# Patient Record
Sex: Female | Born: 2000 | Race: White | Hispanic: No | Marital: Single | Smoking: Never smoker
Health system: Southern US, Community
[De-identification: ages and names within clinical notes are randomized; demographics above are authoritative.]

---
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
## 2022-01-16 ENCOUNTER — Emergency Department: Payer: Medicaid Other

## 2022-01-16 ENCOUNTER — Encounter: Payer: Self-pay | Admitting: Radiology

## 2022-01-16 ENCOUNTER — Emergency Department
Admission: EM | Admit: 2022-01-16 | Discharge: 2022-01-16 | Disposition: A | Discharge status: Home / Self Care | Payer: Medicaid Other | Attending: Emergency Medicine | Admitting: Emergency Medicine

## 2022-01-16 ENCOUNTER — Other Ambulatory Visit: Payer: Self-pay

## 2022-01-16 DIAGNOSIS — S59902A Unspecified injury of left elbow, initial encounter: Secondary | ICD-10-CM | POA: Diagnosis not present

## 2022-01-16 MED ORDER — NAPROXEN 500 MG PO TABS: 500.0000 mg | ORAL_TABLET | Freq: Two times a day (BID) | ORAL | 11 refills | Status: AC

## 2022-01-16 MED ORDER — CYCLOBENZAPRINE HCL 10 MG PO TABS: 10.0000 mg | ORAL_TABLET | Freq: Three times a day (TID) | ORAL | 0 refills | Status: AC | PRN
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
## 2022-01-16 NOTE — Discharge Instructions (Addendum)
-  Ice the elbow periodically throughout the day.  Recommend a elbow sleeve for Ace bandage to help reduce swelling.  -You may take naproxen as needed for pain.  You may additionally take cyclobenzaprine for muscle relaxation, though use caution as it may make you dizzy/drowsy.  -Follow-up with the orthopedist listed in these instructions if your symptoms fail to improve after 1 to 2 weeks.  -Return to the emergency department anytime if you begin to experience any new or worsening symptoms.

## 2022-10-13 DIAGNOSIS — R110 Nausea: Secondary | ICD-10-CM | POA: Diagnosis not present

## 2022-10-13 DIAGNOSIS — E860 Dehydration: Secondary | ICD-10-CM | POA: Diagnosis not present

## 2022-10-20 DIAGNOSIS — Z3A08 8 weeks gestation of pregnancy: Secondary | ICD-10-CM | POA: Diagnosis not present

## 2022-10-20 DIAGNOSIS — O211 Hyperemesis gravidarum with metabolic disturbance: Secondary | ICD-10-CM | POA: Diagnosis not present

## 2022-10-20 DIAGNOSIS — O210 Mild hyperemesis gravidarum: Secondary | ICD-10-CM | POA: Diagnosis not present

## 2022-10-25 DIAGNOSIS — N8311 Corpus luteum cyst of right ovary: Secondary | ICD-10-CM | POA: Diagnosis not present

## 2022-10-25 DIAGNOSIS — Z3A09 9 weeks gestation of pregnancy: Secondary | ICD-10-CM | POA: Diagnosis not present

## 2022-10-25 DIAGNOSIS — Z3201 Encounter for pregnancy test, result positive: Secondary | ICD-10-CM | POA: Diagnosis not present

## 2022-10-25 DIAGNOSIS — O3481 Maternal care for other abnormalities of pelvic organs, first trimester: Secondary | ICD-10-CM | POA: Diagnosis not present

## 2022-11-04 DIAGNOSIS — Z369 Encounter for antenatal screening, unspecified: Secondary | ICD-10-CM | POA: Diagnosis not present

## 2022-11-23 DIAGNOSIS — Z62810 Personal history of physical and sexual abuse in childhood: Secondary | ICD-10-CM | POA: Diagnosis not present

## 2022-11-23 DIAGNOSIS — F1290 Cannabis use, unspecified, uncomplicated: Secondary | ICD-10-CM | POA: Diagnosis not present

## 2022-11-23 DIAGNOSIS — O210 Mild hyperemesis gravidarum: Secondary | ICD-10-CM | POA: Diagnosis not present

## 2022-11-23 DIAGNOSIS — O99322 Drug use complicating pregnancy, second trimester: Secondary | ICD-10-CM | POA: Diagnosis not present

## 2022-11-23 DIAGNOSIS — Z31430 Encounter of female for testing for genetic disease carrier status for procreative management: Secondary | ICD-10-CM | POA: Diagnosis not present

## 2022-11-23 DIAGNOSIS — Z8659 Personal history of other mental and behavioral disorders: Secondary | ICD-10-CM | POA: Diagnosis not present

## 2022-11-23 DIAGNOSIS — Z36 Encounter for antenatal screening for chromosomal anomalies: Secondary | ICD-10-CM | POA: Diagnosis not present

## 2022-11-23 DIAGNOSIS — Z3A13 13 weeks gestation of pregnancy: Secondary | ICD-10-CM | POA: Diagnosis not present

## 2022-11-23 DIAGNOSIS — Z8279 Family history of other congenital malformations, deformations and chromosomal abnormalities: Secondary | ICD-10-CM | POA: Diagnosis not present

## 2022-11-23 DIAGNOSIS — Z363 Encounter for antenatal screening for malformations: Secondary | ICD-10-CM | POA: Diagnosis not present

## 2022-11-23 DIAGNOSIS — Z3481 Encounter for supervision of other normal pregnancy, first trimester: Secondary | ICD-10-CM | POA: Diagnosis not present

## 2023-01-03 DIAGNOSIS — O99322 Drug use complicating pregnancy, second trimester: Secondary | ICD-10-CM | POA: Diagnosis not present

## 2023-01-03 DIAGNOSIS — F1290 Cannabis use, unspecified, uncomplicated: Secondary | ICD-10-CM | POA: Diagnosis not present

## 2023-01-03 DIAGNOSIS — Z3A19 19 weeks gestation of pregnancy: Secondary | ICD-10-CM | POA: Diagnosis not present
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
## 2023-01-24 DIAGNOSIS — Z23 Encounter for immunization: Secondary | ICD-10-CM | POA: Diagnosis not present

## 2023-03-07 DIAGNOSIS — Z62810 Personal history of physical and sexual abuse in childhood: Secondary | ICD-10-CM | POA: Diagnosis not present

## 2023-03-07 DIAGNOSIS — O219 Vomiting of pregnancy, unspecified: Secondary | ICD-10-CM | POA: Diagnosis not present

## 2023-03-07 DIAGNOSIS — Z8659 Personal history of other mental and behavioral disorders: Secondary | ICD-10-CM | POA: Diagnosis not present

## 2023-03-07 DIAGNOSIS — Z148 Genetic carrier of other disease: Secondary | ICD-10-CM | POA: Diagnosis not present

## 2023-03-07 DIAGNOSIS — Z369 Encounter for antenatal screening, unspecified: Secondary | ICD-10-CM | POA: Diagnosis not present

## 2023-03-07 DIAGNOSIS — O99323 Drug use complicating pregnancy, third trimester: Secondary | ICD-10-CM | POA: Diagnosis not present

## 2023-03-07 DIAGNOSIS — Z3A28 28 weeks gestation of pregnancy: Secondary | ICD-10-CM | POA: Diagnosis not present

## 2023-03-07 DIAGNOSIS — F1290 Cannabis use, unspecified, uncomplicated: Secondary | ICD-10-CM | POA: Diagnosis not present

## 2023-03-07 DIAGNOSIS — Z3689 Encounter for other specified antenatal screening: Secondary | ICD-10-CM | POA: Diagnosis not present

## 2023-03-21 DIAGNOSIS — Z23 Encounter for immunization: Secondary | ICD-10-CM | POA: Diagnosis not present

## 2023-04-04 DIAGNOSIS — O99323 Drug use complicating pregnancy, third trimester: Secondary | ICD-10-CM | POA: Diagnosis not present

## 2023-04-04 DIAGNOSIS — F1910 Other psychoactive substance abuse, uncomplicated: Secondary | ICD-10-CM | POA: Diagnosis not present

## 2023-04-04 DIAGNOSIS — Z3A32 32 weeks gestation of pregnancy: Secondary | ICD-10-CM | POA: Diagnosis not present

## 2023-05-02 DIAGNOSIS — Z148 Genetic carrier of other disease: Secondary | ICD-10-CM | POA: Diagnosis not present

## 2023-05-02 DIAGNOSIS — O99323 Drug use complicating pregnancy, third trimester: Secondary | ICD-10-CM | POA: Diagnosis not present

## 2023-05-02 DIAGNOSIS — Z8279 Family history of other congenital malformations, deformations and chromosomal abnormalities: Secondary | ICD-10-CM | POA: Diagnosis not present

## 2023-05-02 DIAGNOSIS — Z3689 Encounter for other specified antenatal screening: Secondary | ICD-10-CM | POA: Diagnosis not present

## 2023-05-02 DIAGNOSIS — F1290 Cannabis use, unspecified, uncomplicated: Secondary | ICD-10-CM | POA: Diagnosis not present

## 2023-05-02 DIAGNOSIS — Z62810 Personal history of physical and sexual abuse in childhood: Secondary | ICD-10-CM | POA: Diagnosis not present

## 2023-05-02 DIAGNOSIS — Z8659 Personal history of other mental and behavioral disorders: Secondary | ICD-10-CM | POA: Diagnosis not present

## 2023-05-02 DIAGNOSIS — Z369 Encounter for antenatal screening, unspecified: Secondary | ICD-10-CM | POA: Diagnosis not present

## 2023-05-02 DIAGNOSIS — Z3A36 36 weeks gestation of pregnancy: Secondary | ICD-10-CM | POA: Diagnosis not present

## 2023-05-16 DIAGNOSIS — O99820 Streptococcus B carrier state complicating pregnancy: Secondary | ICD-10-CM | POA: Diagnosis not present

## 2023-05-16 DIAGNOSIS — Z62810 Personal history of physical and sexual abuse in childhood: Secondary | ICD-10-CM | POA: Diagnosis not present

## 2023-05-16 DIAGNOSIS — O99323 Drug use complicating pregnancy, third trimester: Secondary | ICD-10-CM | POA: Diagnosis not present

## 2023-05-16 DIAGNOSIS — F1290 Cannabis use, unspecified, uncomplicated: Secondary | ICD-10-CM | POA: Diagnosis not present

## 2023-05-16 DIAGNOSIS — O1203 Gestational edema, third trimester: Secondary | ICD-10-CM | POA: Diagnosis not present

## 2023-05-16 DIAGNOSIS — Z3A38 38 weeks gestation of pregnancy: Secondary | ICD-10-CM | POA: Diagnosis not present

## 2023-05-16 DIAGNOSIS — Z8279 Family history of other congenital malformations, deformations and chromosomal abnormalities: Secondary | ICD-10-CM | POA: Diagnosis not present

## 2023-05-16 DIAGNOSIS — Z148 Genetic carrier of other disease: Secondary | ICD-10-CM | POA: Diagnosis not present

## 2023-05-16 DIAGNOSIS — Z8659 Personal history of other mental and behavioral disorders: Secondary | ICD-10-CM | POA: Diagnosis not present

## 2023-05-21 DIAGNOSIS — O99013 Anemia complicating pregnancy, third trimester: Secondary | ICD-10-CM | POA: Diagnosis not present

## 2023-05-21 DIAGNOSIS — N939 Abnormal uterine and vaginal bleeding, unspecified: Secondary | ICD-10-CM | POA: Diagnosis not present

## 2023-05-21 DIAGNOSIS — Z79899 Other long term (current) drug therapy: Secondary | ICD-10-CM | POA: Diagnosis not present

## 2023-05-21 DIAGNOSIS — O26853 Spotting complicating pregnancy, third trimester: Secondary | ICD-10-CM | POA: Diagnosis not present

## 2023-05-21 DIAGNOSIS — D649 Anemia, unspecified: Secondary | ICD-10-CM | POA: Diagnosis not present

## 2023-05-21 DIAGNOSIS — Z87891 Personal history of nicotine dependence: Secondary | ICD-10-CM | POA: Diagnosis not present

## 2023-05-21 DIAGNOSIS — Z3A38 38 weeks gestation of pregnancy: Secondary | ICD-10-CM | POA: Diagnosis not present

## 2023-05-25 DIAGNOSIS — O9902 Anemia complicating childbirth: Secondary | ICD-10-CM | POA: Diagnosis not present

## 2023-05-25 DIAGNOSIS — Z148 Genetic carrier of other disease: Secondary | ICD-10-CM | POA: Diagnosis not present

## 2023-05-25 DIAGNOSIS — O471 False labor at or after 37 completed weeks of gestation: Secondary | ICD-10-CM | POA: Diagnosis not present

## 2023-05-25 DIAGNOSIS — O99324 Drug use complicating childbirth: Secondary | ICD-10-CM | POA: Diagnosis not present

## 2023-05-25 DIAGNOSIS — Z3A39 39 weeks gestation of pregnancy: Secondary | ICD-10-CM | POA: Diagnosis not present

## 2023-05-25 DIAGNOSIS — F1290 Cannabis use, unspecified, uncomplicated: Secondary | ICD-10-CM | POA: Diagnosis not present

## 2023-05-26 DIAGNOSIS — Z3A39 39 weeks gestation of pregnancy: Secondary | ICD-10-CM | POA: Diagnosis not present

## 2023-05-27 DIAGNOSIS — Z3A39 39 weeks gestation of pregnancy: Secondary | ICD-10-CM | POA: Diagnosis not present

## 2023-10-17 ENCOUNTER — Telehealth: Payer: Self-pay | Admitting: *Deleted

## 2023-10-17 DIAGNOSIS — Z7189 Other specified counseling: Secondary | ICD-10-CM

## 2023-10-17 NOTE — Progress Notes (Unsigned)
 Complex Care Management Note Care Guide Note  10/17/2023 Name: Courtney Sandoval MRN: [id number] DOB: November 14, 2000   Complex Care Management Outreach Attempts: An unsuccessful telephone outreach was attempted today to offer the patient information about available complex care management services.  Follow Up Plan:  Additional outreach attempts will be made to offer the patient complex care management information and services.   Encounter Outcome:  No Answer  Thedford Franks, CMA [place]  [facility], [facility] Guide Direct Dial: [phone]  Fax: [phone] Website: [email or web address]

## 2023-10-18 NOTE — Progress Notes (Unsigned)
 Complex Care Management Note Care Guide Note  10/18/2023 Name: Courtney Sandoval MRN: [id number] DOB: 2000/05/29   Complex Care Management Outreach Attempts: A second unsuccessful outreach was attempted today to offer the patient with information about available complex care management services.  Follow Up Plan:  Additional outreach attempts will be made to offer the patient complex care management information and services.   Encounter Outcome:  No Answer  Thedford Franks, CMA [place]  [facility], [facility] Guide Direct Dial: [phone]  Fax: [phone] Website: [email or web address]

## 2023-10-19 NOTE — Progress Notes (Signed)
 Complex Care Management Note Care Guide Note  10/19/2023 Name: Courtney Sandoval MRN: [id number] DOB: Aug 25, 2000   Complex Care Management Outreach Attempts: A third unsuccessful outreach was attempted today to offer the patient with information about available complex care management services.  Follow Up Plan:  No further outreach attempts will be made at this time. We have been unable to contact the patient to offer or enroll patient in complex care management services.  Encounter Outcome:  No Answer  Thedford Franks, CMA [place]  [facility], [facility] Guide Direct Dial: [phone]  Fax: [phone] Website: [email or web address]

## 2023-12-14 DIAGNOSIS — R059 Cough, unspecified: Secondary | ICD-10-CM | POA: Diagnosis not present

## 2023-12-14 DIAGNOSIS — Z20822 Contact with and (suspected) exposure to covid-19: Secondary | ICD-10-CM | POA: Diagnosis not present

## 2023-12-14 DIAGNOSIS — J029 Acute pharyngitis, unspecified: Secondary | ICD-10-CM | POA: Diagnosis not present
